# Patient Record
Sex: Female | Born: 2000 | Race: White | Hispanic: No | Marital: Single | Smoking: Never smoker
Health system: Southern US, Community
[De-identification: ages and names within clinical notes are randomized; demographics above are authoritative.]

---
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
## 2019-08-27 ENCOUNTER — Emergency Department
Admission: EM | Admit: 2019-08-27 | Discharge: 2019-08-27 | Disposition: A | Discharge status: Home / Self Care | Payer: BLUE CROSS/BLUE SHIELD | Attending: Emergency Medicine | Admitting: Emergency Medicine

## 2019-08-27 ENCOUNTER — Emergency Department: Payer: BLUE CROSS/BLUE SHIELD

## 2019-08-27 ENCOUNTER — Other Ambulatory Visit: Payer: Self-pay

## 2019-08-27 DIAGNOSIS — R109 Unspecified abdominal pain: Secondary | ICD-10-CM

## 2019-08-27 DIAGNOSIS — K599 Functional intestinal disorder, unspecified: Secondary | ICD-10-CM | POA: Insufficient documentation

## 2019-08-27 DIAGNOSIS — R1011 Right upper quadrant pain: Secondary | ICD-10-CM | POA: Insufficient documentation

## 2019-08-27 LAB — URINALYSIS, COMPLETE (UACMP) WITH MICROSCOPIC
Bacteria, UA: NONE SEEN
Bilirubin Urine: NEGATIVE
Glucose, UA: NEGATIVE mg/dL
Ketones, ur: 20 mg/dL — AB
Leukocytes,Ua: NEGATIVE
Nitrite: NEGATIVE
Protein, ur: 30 mg/dL — AB
Specific Gravity, Urine: 1.026 (ref 1.005–1.030)
pH: 5 (ref 5.0–8.0)

## 2019-08-27 LAB — COMPREHENSIVE METABOLIC PANEL
ALT: 18 U/L (ref 0–44)
AST: 21 U/L (ref 15–41)
Albumin: 4.5 g/dL (ref 3.5–5.0)
Alkaline Phosphatase: 55 U/L (ref 38–126)
Anion gap: 13 (ref 5–15)
BUN: 19 mg/dL (ref 6–20)
CO2: 22 mmol/L (ref 22–32)
Calcium: 9 mg/dL (ref 8.9–10.3)
Chloride: 102 mmol/L (ref 98–111)
Creatinine, Ser: 0.67 mg/dL (ref 0.44–1.00)
GFR calc Af Amer: >60 mL/min (ref >60)
GFR calc non Af Amer: >60 mL/min (ref >60)
Glucose, Bld: 96 mg/dL (ref 70–99)
Potassium: 3.7 mmol/L (ref 3.5–5.1)
Sodium: 137 mmol/L (ref 135–145)
Total Bilirubin: 0.8 mg/dL (ref 0.3–1.2)
Total Protein: 7.9 g/dL (ref 6.5–8.1)

## 2019-08-27 LAB — MONONUCLEOSIS SCREEN: Mono Screen: NEGATIVE

## 2019-08-27 LAB — CBC
HCT: 36.6 % (ref 36.0–46.0)
Hemoglobin: 12.2 g/dL (ref 12.0–15.0)
MCH: 27.5 pg (ref 26.0–34.0)
MCHC: 33.3 g/dL (ref 30.0–36.0)
MCV: 82.4 fL (ref 80.0–100.0)
Platelets: 278 10*3/uL (ref 150–400)
RBC: 4.44 MIL/uL (ref 3.87–5.11)
RDW: 14 % (ref 11.5–15.5)
WBC: 6.4 10*3/uL (ref 4.0–10.5)
nRBC: 0 % (ref 0.0–0.2)

## 2019-08-27 LAB — LIPASE, BLOOD: Lipase: 23 U/L (ref 11–51)

## 2019-08-27 LAB — PREGNANCY, URINE: Preg Test, Ur: NEGATIVE

## 2019-08-27 MED ORDER — DROPERIDOL 2.5 MG/ML IJ SOLN
2.5000 mg | Freq: Once | INTRAMUSCULAR | Status: AC
  Administered 2019-08-27: 2.5 mg via INTRAMUSCULAR
  Filled 2019-08-27: qty 2

## 2019-08-27 NOTE — ED Triage Notes (Signed)
Pt presents via POV with c/o RUQ abdominal pain that has been ongoing for 3 months. Pt denies vomiting or diarrhea  with pain. Pt describes pain as dull. Pt currently alert and oriented x4.

## 2019-08-27 NOTE — Discharge Instructions (Addendum)
Please take Tylenol and ibuprofen/Advil for your pain.  It is safe to take them together, or to alternate them every few hours.  Take up to 1000mg  of Tylenol at a time, up to 4 times per day.  Do not take more than 4000 mg of Tylenol in 24 hours.  For ibuprofen, take 400-600 mg, 4-5 times per day.  The ultrasound of your gallbladder and liver did not show any gallstones or problems. Your blood work is normal.  If you develop any fevers with your pain, frequent vomiting, chest pain or other associated symptoms, please return to the ED.

## 2019-08-27 NOTE — ED Provider Notes (Signed)
[facility] Emergency Department Provider Note ____________________________________________   First MD Initiated Contact with Patient 08/27/19 1959     (approximate)  I have reviewed the triage vital signs and the nursing notes.  HISTORY  Chief Complaint Abdominal Pain   HPI Angel Townsend is a 19 y.o. femalewho presents to the ED for evaluation of RUQ pain.  Chart review indicates 8/3 ED visit at OSH via care everywhere for right-sided abdominal pain.  Patient reports about 2 months of upper abdominal pain, primarily to the RUQ.  Patient reports the pain has been constant for a matter of weeks, not improving with Tylenol or NSAIDs, and aching pressure in nature and 5/10 intensity.  She denies any associated symptoms beyond her pain that has been constant she reports significant anxiety and distress about not knowing what the cause of her symptoms are.  Denies associated fevers, postprandial worsening of her pain, vomiting, diarrhea, dysuria, vaginal bleeding or discharge, chest pain, shortness of breath, syncope or cough.  She reports tolerating p.o. intake at her baseline and toileting at her baseline.  History reviewed. No pertinent past medical history.  There are no problems to display for this patient.   History reviewed. No pertinent surgical history.  Prior to Admission medications   Not on File    Allergies Patient has no known allergies.  History reviewed. No pertinent family history.  Social History Social History   Tobacco Use  . Smoking status: Never Smoker  . Smokeless tobacco: Never Used  Substance Use Topics  . Alcohol use: Not on file  . Drug use: Not on file    Review of Systems  Constitutional: No fever/chills Eyes: No visual changes. ENT: No sore throat. Cardiovascular: Denies chest pain. Respiratory: Denies shortness of breath. Gastrointestinal: Positive for abdominal pain.  No nausea, no vomiting.  No  diarrhea.  No constipation. Genitourinary: Negative for dysuria. Musculoskeletal: Negative for back pain. Skin: Negative for rash. Neurological: Negative for headaches, focal weakness or numbness.   ____________________________________________   PHYSICAL EXAM:  VITAL SIGNS: Vitals:   08/27/19 1349 08/27/19 1656  BP: (!) 129/101 134/85  Pulse: 88 69  Resp: 17 16  Temp: 98.7 F (37.1 C)   SpO2: 99%       Constitutional: Alert and oriented. Well appearing and in no acute distress.  Well-appearing and conversational full sentences without distress. Eyes: Conjunctivae are normal. PERRL. EOMI. Head: Atraumatic. Nose: No congestion/rhinnorhea. Mouth/Throat: Mucous membranes are moist.  Oropharynx non-erythematous. Neck: No stridor. No cervical spine tenderness to palpation. Cardiovascular: Normal rate, regular rhythm. Grossly normal heart sounds.  Good peripheral circulation. Respiratory: Normal respiratory effort.  No retractions. Lungs CTAB. Gastrointestinal: Soft , nondistended, nontender to palpation. No abdominal bruits. No CVA tenderness. Musculoskeletal: No lower extremity tenderness nor edema.  No joint effusions. No signs of acute trauma. Neurologic:  Normal speech and language. No gross focal neurologic deficits are appreciated. No gait instability noted. Skin:  Skin is warm, dry and intact. No rash noted. Psychiatric: Mood and affect are normal. Speech and behavior are normal.  ____________________________________________   LABS (all labs ordered are listed, but only abnormal results are displayed)  Labs Reviewed  URINALYSIS, COMPLETE (UACMP) WITH MICROSCOPIC - Abnormal; Notable for the following components:      Result Value   Color, Urine YELLOW (*)    APPearance HAZY (*)    Hgb urine dipstick LARGE (*)    Ketones, ur 20 (*)    Protein, ur 30 (*)  All other components within normal limits  LIPASE, BLOOD  COMPREHENSIVE METABOLIC PANEL  CBC  MONONUCLEOSIS  SCREEN  POC URINE PREG, ED   ____________________________________________  12 Lead EKG   ____________________________________________  RADIOLOGY  ED MD interpretation:  RUQ ultrasound without evidence of acute pathology  Official radiology report(s): US Abdomen Limited RUQ  Result Date: 08/27/2019 CLINICAL DATA:  19 year old female with right upper quadrant abdominal pain. EXAM: ULTRASOUND ABDOMEN LIMITED RIGHT UPPER QUADRANT COMPARISON:  None. FINDINGS: Gallbladder: No gallstones or wall thickening visualized. No sonographic Murphy sign noted by sonographer. Common bile duct: Diameter: 3 mm Liver: No focal lesion identified. Within normal limits in parenchymal echogenicity. Portal vein is patent on color Doppler imaging with normal direction of blood flow towards the liver. Other: None. IMPRESSION: Unremarkable right upper quadrant ultrasound. Electronically Signed   By: Elgie Collard M.D.   On: 08/27/2019 20:41    ____________________________________________   PROCEDURES and INTERVENTIONS  Procedure(s) performed (including Critical Care):  Procedures  Medications  droperidol (INAPSINE) 2.5 MG/ML injection 2.5 mg (has no administration in time range)    ____________________________________________   MDM / ED COURSE  Otherwise healthy 19 year old female presenting with chronic abdominal pain without evidence of acute pathology and amenable to outpatient management.  Normal vital signs on room air.  Exam without evidence of acute derangement.  Blood work without acute derangements.  Urine with evidence of dehydration, but no infectious features.  Patient reports resolving symptoms after droperidol administration.  RUQ ultrasound without evidence of biliary pathology as the source of her symptoms.  Due to the chronicity of her symptoms, no associated symptoms, benign exam and work-up I see no evidence of acute pathology to warrant additional work-up here in the ED or  hospitalization.  We discussed outpatient management and return precautions for the ED.  Patient medically stable for discharge home.  ____________________________________________   FINAL CLINICAL IMPRESSION(S) / ED DIAGNOSES  Final diagnoses:  RUQ abdominal pain  Functional abdominal pain syndrome     ED Discharge Orders    None       Angel Townsend   Note:  This document was prepared using Dragon voice recognition software and may include unintentional dictation errors.   Delton Prairie, MD 08/27/19 2106

## 2019-08-29 ENCOUNTER — Other Ambulatory Visit: Payer: Self-pay

## 2019-08-29 DIAGNOSIS — M5440 Lumbago with sciatica, unspecified side: Secondary | ICD-10-CM

## 2019-08-31 ENCOUNTER — Emergency Department: Payer: BLUE CROSS/BLUE SHIELD

## 2019-08-31 ENCOUNTER — Other Ambulatory Visit: Payer: Self-pay

## 2019-08-31 ENCOUNTER — Emergency Department
Admission: EM | Admit: 2019-08-31 | Discharge: 2019-08-31 | Disposition: A | Discharge status: Home / Self Care | Payer: BLUE CROSS/BLUE SHIELD | Attending: Emergency Medicine | Admitting: Emergency Medicine

## 2019-08-31 DIAGNOSIS — Z3202 Encounter for pregnancy test, result negative: Secondary | ICD-10-CM | POA: Diagnosis not present

## 2019-08-31 DIAGNOSIS — R1012 Left upper quadrant pain: Secondary | ICD-10-CM

## 2019-08-31 LAB — URINALYSIS, COMPLETE (UACMP) WITH MICROSCOPIC
Bilirubin Urine: NEGATIVE
Glucose, UA: NEGATIVE mg/dL
Ketones, ur: 5 mg/dL — AB
Leukocytes,Ua: NEGATIVE
Nitrite: NEGATIVE
Protein, ur: NEGATIVE mg/dL
Specific Gravity, Urine: 1.015 (ref 1.005–1.030)
pH: 6 (ref 5.0–8.0)

## 2019-08-31 LAB — COMPREHENSIVE METABOLIC PANEL
ALT: 16 U/L (ref 0–44)
AST: 18 U/L (ref 15–41)
Albumin: 4.6 g/dL (ref 3.5–5.0)
Alkaline Phosphatase: 59 U/L (ref 38–126)
Anion gap: 7 (ref 5–15)
BUN: 10 mg/dL (ref 6–20)
CO2: 27 mmol/L (ref 22–32)
Calcium: 9.3 mg/dL (ref 8.9–10.3)
Chloride: 105 mmol/L (ref 98–111)
Creatinine, Ser: 0.69 mg/dL (ref 0.44–1.00)
GFR calc Af Amer: >60 mL/min (ref >60)
GFR calc non Af Amer: >60 mL/min (ref >60)
Glucose, Bld: 92 mg/dL (ref 70–99)
Potassium: 3.9 mmol/L (ref 3.5–5.1)
Sodium: 139 mmol/L (ref 135–145)
Total Bilirubin: 0.8 mg/dL (ref 0.3–1.2)
Total Protein: 8 g/dL (ref 6.5–8.1)

## 2019-08-31 LAB — CBC
HCT: 39.6 % (ref 36.0–46.0)
Hemoglobin: 13.1 g/dL (ref 12.0–15.0)
MCH: 27.4 pg (ref 26.0–34.0)
MCHC: 33.1 g/dL (ref 30.0–36.0)
MCV: 82.8 fL (ref 80.0–100.0)
Platelets: 281 10*3/uL (ref 150–400)
RBC: 4.78 MIL/uL (ref 3.87–5.11)
RDW: 14 % (ref 11.5–15.5)
WBC: 4.7 10*3/uL (ref 4.0–10.5)
nRBC: 0 % (ref 0.0–0.2)

## 2019-08-31 LAB — POCT PREGNANCY, URINE: Preg Test, Ur: NEGATIVE

## 2019-08-31 LAB — LIPASE, BLOOD: Lipase: 24 U/L (ref 11–51)

## 2019-08-31 MED ORDER — IOHEXOL 9 MG/ML PO SOLN
500.0000 mL | Freq: Two times a day (BID) | ORAL | Status: DC | PRN
  Administered 2019-08-31: 500 mL via ORAL
  Filled 2019-08-31 (×2): qty 500

## 2019-08-31 MED ORDER — DICYCLOMINE HCL 10 MG PO CAPS: 10.0000 mg | ORAL_CAPSULE | Freq: Four times a day (QID) | ORAL | 0 refills | Status: AC | PRN

## 2019-08-31 MED ORDER — IOHEXOL 300 MG/ML  SOLN
75.0000 mL | Freq: Once | INTRAMUSCULAR | Status: AC | PRN
  Administered 2019-08-31: 75 mL via INTRAVENOUS
  Filled 2019-08-31: qty 75

## 2019-08-31 NOTE — ED Notes (Signed)
Attempted IV start x2, will IV team consult

## 2019-08-31 NOTE — ED Provider Notes (Signed)
[facility] Emergency Department Provider Note  ____________________________________________  Time seen: Approximately 5:16 PM  I have reviewed the triage vital signs and the nursing notes.   HISTORY  Chief Complaint Abdominal Pain    HPI Angel Townsend is a 19 y.o. female who presents emergency department complaining of abdominal pain x1 month.  Patient states that she initially had abdominal pain in the right upper quadrant.  She was evaluated with reassuring labs, reassuring ultrasound.  Patient states that her pain continued, and now seems to a transition to the left upper quadrant.  Still mild symptoms in the right upper quadrant but pain has shifted to the left side.  No flank pain.  No emesis, diarrhea or constipation.  Patient has had no blood in her stools.  No hematuria, dysuria, polyuria.  No vaginal bleeding or discharge.         History reviewed. No pertinent past medical history.  There are no problems to display for this patient.   History reviewed. No pertinent surgical history.  Prior to Admission medications   Medication Sig Start Date End Date Taking? Authorizing Provider  dicyclomine (BENTYL) 10 MG capsule Take 1 capsule (10 mg total) by mouth 4 (four) times daily as needed for up to 14 days (abdominal pain). 08/31/19 09/14/19  Dalia Jollie, Delorise Royals, PA-C    Allergies Patient has no known allergies.  No family history on file.  Social History Social History   Tobacco Use   Smoking status: Never Smoker   Smokeless tobacco: Never Used  Substance Use Topics   Alcohol use: Never   Drug use: Never     Review of Systems  Constitutional: No fever/chills Eyes: No visual changes. No discharge ENT: No upper respiratory complaints. Cardiovascular: no chest pain. Respiratory: no cough. No SOB. Gastrointestinal: Bilateral upper quadrant abdominal pain x1 month, originally starting on right now primarily on left.  No nausea,  no vomiting.  No diarrhea.  No constipation. Genitourinary: Negative for dysuria. No hematuria Musculoskeletal: Negative for musculoskeletal pain. Skin: Negative for rash, abrasions, lacerations, ecchymosis. Neurological: Negative for headaches, focal weakness or numbness. 10-point ROS otherwise negative.  ____________________________________________   PHYSICAL EXAM:  VITAL SIGNS: ED Triage Vitals  Enc Vitals Group     BP 08/31/19 1118 (!) 132/97     Pulse Rate 08/31/19 1118 (!) 105     Resp 08/31/19 1118 18     Temp 08/31/19 1118 98.4 F (36.9 C)     Temp src --      SpO2 08/31/19 1118 98 %     Weight 08/31/19 1119 127 lb (57.6 kg)     Height 08/31/19 1119 5\' 6"  (1.676 m)     Head Circumference --      Peak Flow --      Pain Score 08/31/19 1118 6     Pain Loc --      Pain Edu? --      Excl. in GC? --      Constitutional: Alert and oriented. Well appearing and in no acute distress. Eyes: Conjunctivae are normal. PERRL. EOMI. Head: Atraumatic. ENT:      Ears:       Nose: No congestion/rhinnorhea.      Mouth/Throat: Mucous membranes are moist.  Neck: No stridor.    Cardiovascular: Normal rate, regular rhythm. Normal S1 and S2.  Good peripheral circulation. Respiratory: Normal respiratory effort without tachypnea or retractions. Lungs CTAB. Good air entry to the bases with no decreased or absent  breath sounds. Gastrointestinal: Bowel sounds 4 quadrants.  Soft to palpation all quadrants.  Minimally tender to palpation in the left upper quadrant.  No palpable abnormality in this region.  No significant tenderness on the right upper quadrant.  No other tenderness.  No guarding or rigidity. No palpable masses. No distention. No CVA tenderness. Musculoskeletal: Full range of motion to all extremities. No gross deformities appreciated. Neurologic:  Normal speech and language. No gross focal neurologic deficits are appreciated.  Skin:  Skin is warm, dry and intact. No rash  noted. Psychiatric: Mood and affect are normal. Speech and behavior are normal. Patient exhibits appropriate insight and judgement.   ____________________________________________   LABS (all labs ordered are listed, but only abnormal results are displayed)  Labs Reviewed  URINALYSIS, COMPLETE (UACMP) WITH MICROSCOPIC - Abnormal; Notable for the following components:      Result Value   Color, Urine YELLOW (*)    APPearance CLEAR (*)    Hgb urine dipstick MODERATE (*)    Ketones, ur 5 (*)    Bacteria, UA RARE (*)    All other components within normal limits  LIPASE, BLOOD  COMPREHENSIVE METABOLIC PANEL  CBC  POCT PREGNANCY, URINE  POC URINE PREG, ED   ____________________________________________  EKG   ____________________________________________  RADIOLOGY I personally viewed and evaluated these images as part of my medical decision making, as well as reviewing the written report by the radiologist.  CT ABDOMEN PELVIS W CONTRAST  Result Date: 08/31/2019 CLINICAL DATA:  Right upper quadrant and left upper quadrant pain EXAM: CT ABDOMEN AND PELVIS WITH CONTRAST TECHNIQUE: Multidetector CT imaging of the abdomen and pelvis was performed using the standard protocol following bolus administration of intravenous contrast. CONTRAST:  51mL OMNIPAQUE IOHEXOL 300 MG/ML  SOLN COMPARISON:  None. FINDINGS: Lower chest: The visualized heart size within normal limits. No pericardial fluid/thickening. No hiatal hernia. The visualized portions of the lungs are clear. Hepatobiliary: The liver is normal in density without focal abnormality.The main portal vein is patent. No evidence of calcified gallstones, gallbladder wall thickening or biliary dilatation. Pancreas: Unremarkable. No pancreatic ductal dilatation or surrounding inflammatory changes. Spleen: Normal in size without focal abnormality. Adrenals/Urinary Tract: Both adrenal glands appear normal. The kidneys and collecting system appear  normal without evidence of urinary tract calculus or hydronephrosis. Bladder is unremarkable. Stomach/Bowel: There is a mildly dilated contrast and debris filled stomach. No definite wall thickening however is noted. Small bowel, and colon are normal in appearance. No inflammatory changes, wall thickening, or obstructive findings.The appendix is normal. Vascular/Lymphatic: There are no enlarged mesenteric, retroperitoneal, or pelvic lymph nodes. No significant vascular findings are present. Reproductive:  The uterus and adnexa are unremarkable. Other: No evidence of abdominal wall mass or hernia. Musculoskeletal: No acute or significant osseous findings. IMPRESSION: No acute intra-abdominal or pelvic pathology to explain the patient's symptoms Electronically Signed   By: Jonna Clark M.D.   On: 08/31/2019 19:33    ____________________________________________    PROCEDURES  Procedure(s) performed:    Procedures    Medications  iohexol (OMNIPAQUE) 9 MG/ML oral solution 500 mL (500 mLs Oral Contrast Given 08/31/19 1744)  iohexol (OMNIPAQUE) 300 MG/ML solution 75 mL (75 mLs Intravenous Contrast Given 08/31/19 1917)     ____________________________________________   INITIAL IMPRESSION / ASSESSMENT AND PLAN / ED COURSE  Pertinent labs & imaging results that were available during my care of the patient were reviewed by me and considered in my medical decision making (see chart for details).  Review of the [place] CSRS was performed in accordance of the [facility] prior to dispensing any controlled drugs.           Patient's diagnosis is consistent with nonspecific left upper quadrant abdominal pain.  Patient presents emergency department with abdominal pain x1 month.  Initially symptoms began on the right, now are primarily on the left upper quadrant.  Patient had reassuring labs, ultrasound at the initial onset of her right upper quadrant abdominal pain.  With pain moving to the left upper quadrant,  patient returns for evaluation.  Overall labs are reassuring.  Exam was reassuring.  Imaging with CT scan was undertaken given the new location of her pain and length of duration.  CT reveals no findings to explain the patient's symptoms.  Overall reassuring CT.  This time I have recommended follow-up with GI.  Bentyl for symptom relief..  Return precautions are discussed with the patient.  Patient is given ED precautions to return to the ED for any worsening or new symptoms.     ____________________________________________  FINAL CLINICAL IMPRESSION(S) / ED DIAGNOSES  Final diagnoses:  Left upper quadrant abdominal pain      NEW MEDICATIONS STARTED DURING THIS VISIT:  ED Discharge Orders         Ordered    dicyclomine (BENTYL) 10 MG capsule  4 times daily PRN        08/31/19 1946              This chart was dictated using voice recognition software/Dragon. Despite best efforts to proofread, errors can occur which can change the meaning. Any change was purely unintentional.    Racheal Patches, PA-C 08/31/19 1948    Delton Prairie, MD 09/01/19 [phone]

## 2019-08-31 NOTE — ED Notes (Signed)
Pt reports abdominal pain (RUQ, radiate to back) since July.   Pt reports "a couple days ago" she began having "gas like cramping pain" to her LUQ. Reports taking gas-x with minimal relief.   Denies vomiting/diarrhea, confirms intermittent nausea   Reports pain is worse at night. Denies urinary frequency or pain with urination.

## 2019-08-31 NOTE — ED Triage Notes (Addendum)
Pt comes via POV from home with c/o bilateral abdomen pain. Pt states she was just evaluated for this same thing and an [place] was done. Pt states pain is worse and she feels like maybe she needs an CT scan.  Pt states some nausea.  Pt states it has been ongoing and has just changed. Pt states some decreased appetite.

## 2019-09-18 ENCOUNTER — Other Ambulatory Visit: Payer: Self-pay

## 2019-09-18 ENCOUNTER — Ambulatory Visit
Admission: RE | Admit: 2019-09-18 | Discharge: 2019-09-18 | Disposition: A | Discharge status: Home / Self Care | Payer: BLUE CROSS/BLUE SHIELD | Source: Ambulatory Visit

## 2019-09-18 DIAGNOSIS — M5440 Lumbago with sciatica, unspecified side: Secondary | ICD-10-CM | POA: Insufficient documentation

## 2019-11-13 ENCOUNTER — Ambulatory Visit: Payer: BLUE CROSS/BLUE SHIELD | Admitting: Gastroenterology

## 2019-12-10 ENCOUNTER — Other Ambulatory Visit: Payer: Self-pay | Admitting: Neurology

## 2019-12-10 DIAGNOSIS — G44209 Tension-type headache, unspecified, not intractable: Secondary | ICD-10-CM

## 2019-12-19 ENCOUNTER — Other Ambulatory Visit: Payer: BLUE CROSS/BLUE SHIELD

## 2022-04-27 IMAGING — MR MR LUMBAR SPINE W/O CM
5 series · 32 of 48 positions shown · non-contrast
Comparison: None.

CLINICAL DATA: Central low back pain. Two months of peripheral
abdominal pain

EXAM:
MRI LUMBAR SPINE WITHOUT CONTRAST
TECHNIQUE: Multiplanar, multisequence MR imaging of the lumbar spine was
performed. No intravenous contrast was administered.

[Series 5: T2 · sagittal · 4.0mm · 0.81mm/px · 6 of 15 slices shown (1 of 2)]
[im 1/15]
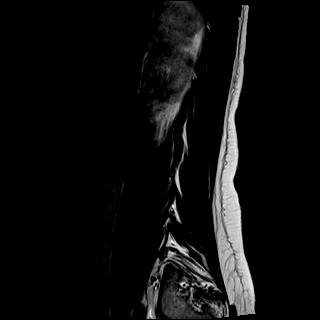
[im 3/15]
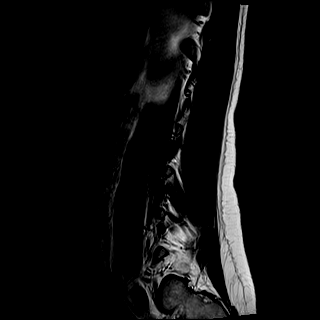
[im 6/15]
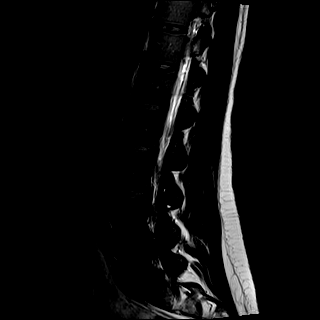
[im 9/15]
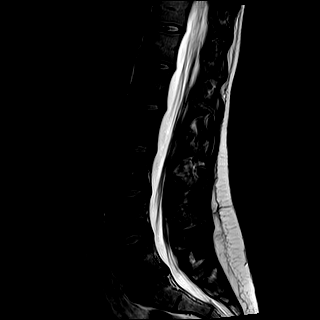
[im 12/15]
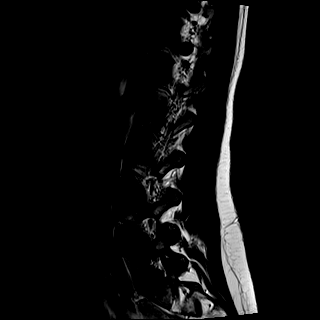
[im 15/15]
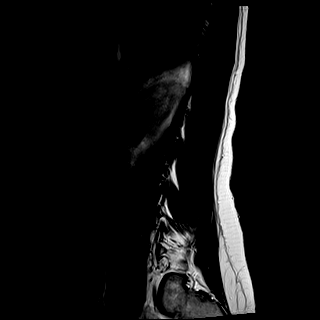

[Series 6: T1 · sagittal · 4.0mm · 0.81mm/px · 6 of 15 slices shown (1 of 2)]
[im 1/15]
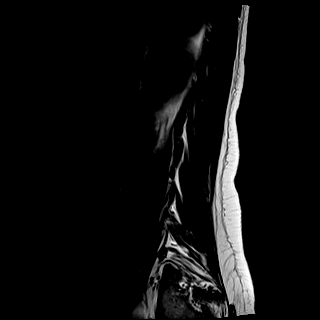
[im 3/15]
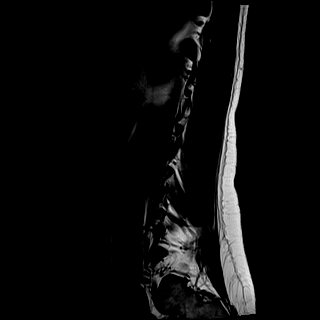
[im 6/15]
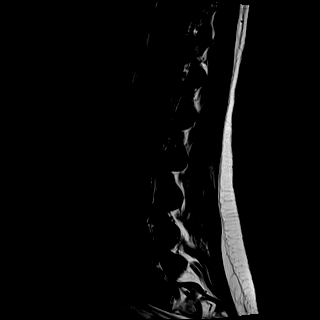
[im 9/15]
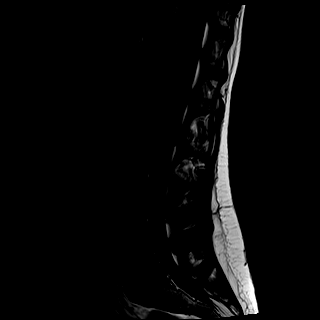
[im 12/15]
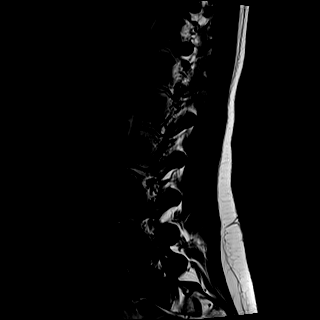
[im 15/15]
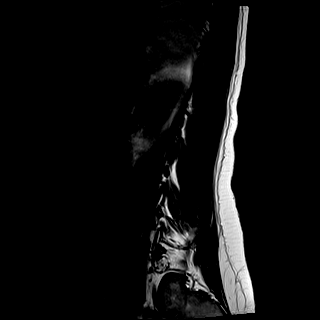

[Series 7: STIR · sagittal · 4.0mm · 0.41mm/px · 2 of 15 slices shown]
[im 1/15]
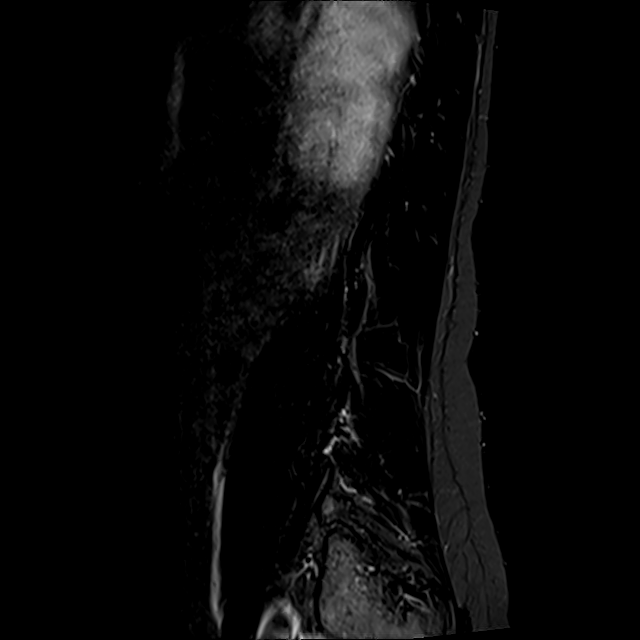
[im 3/15]
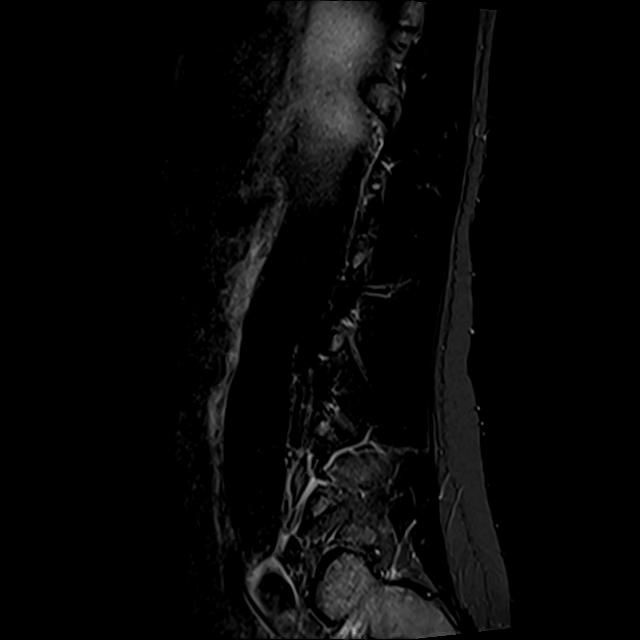

[Series 8: T2 · axial · 4.0mm · 0.78mm/px · z∈[-177,+33]mm · 9 of 36 slices shown (2 of 2)]
[im 1/36]
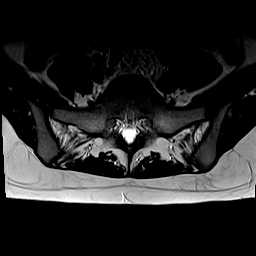
[im 6/36]
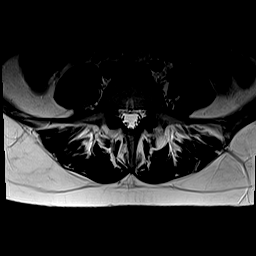
[im 11/36]
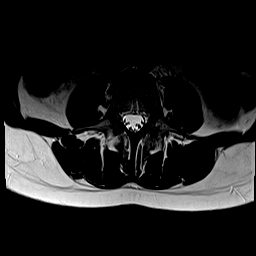
[im 16/36]
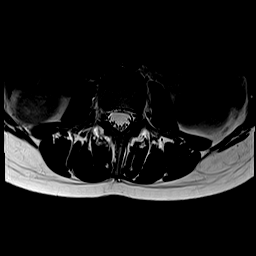
[im 18/36]
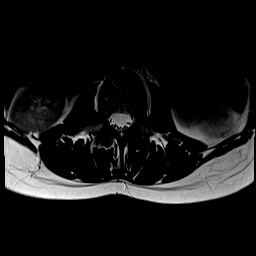
[im 21/36]
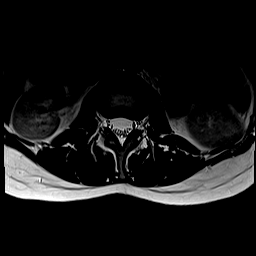
[im 26/36]
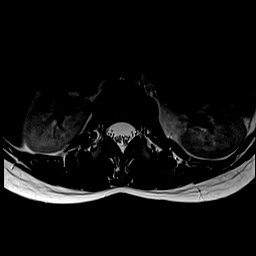
[im 31/36]
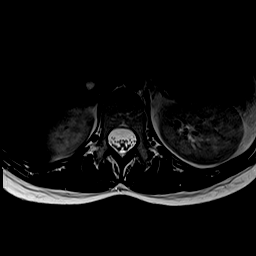
[im 36/36]
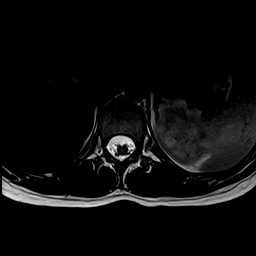

[Series 9: T1 · axial · 4.0mm · 0.39mm/px · z∈[-177,+33]mm · 9 of 36 slices shown (2 of 2)]
[im 1/36]
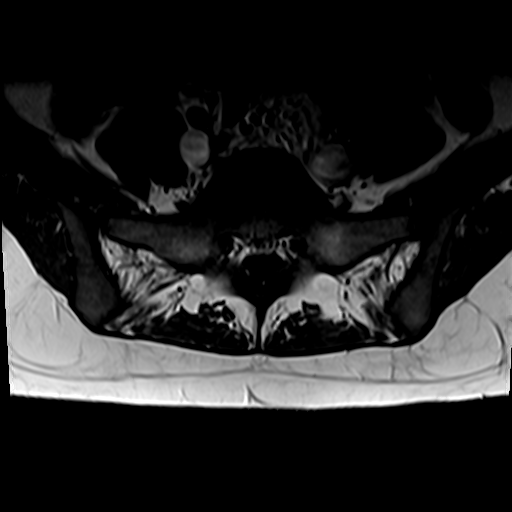
[im 6/36]
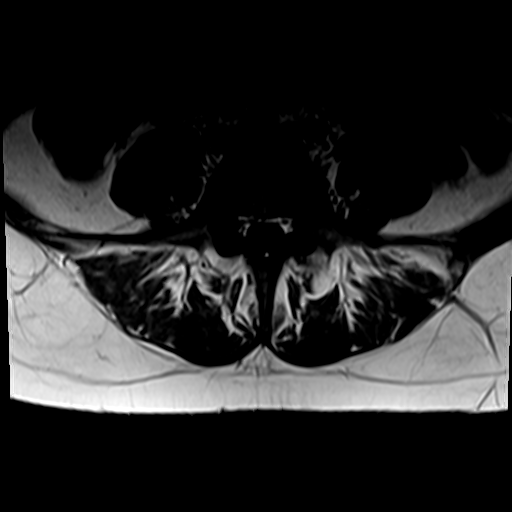
[im 11/36]
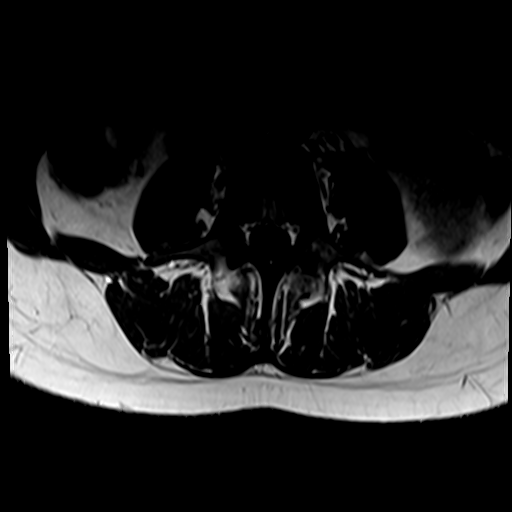
[im 16/36]
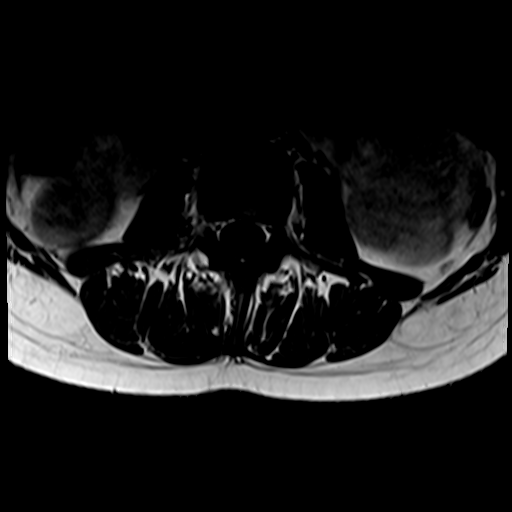
[im 18/36]
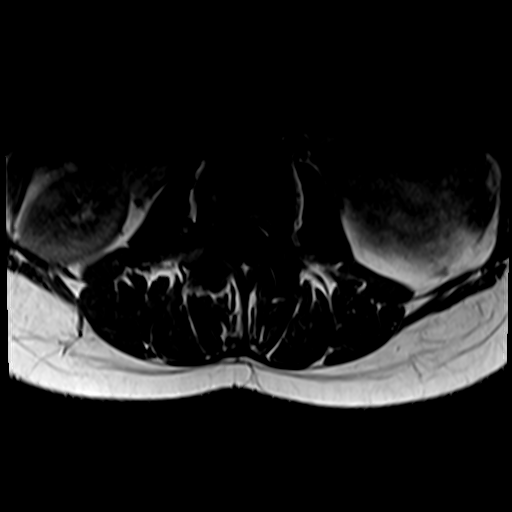
[im 21/36]
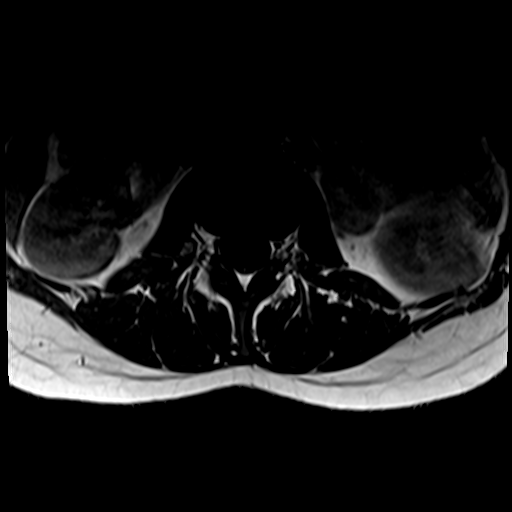
[im 26/36]
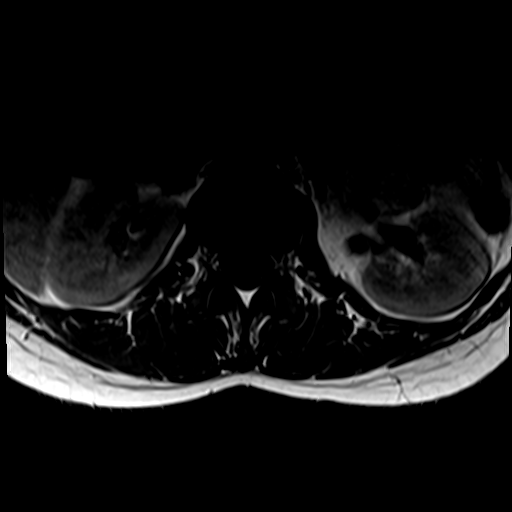
[im 31/36]
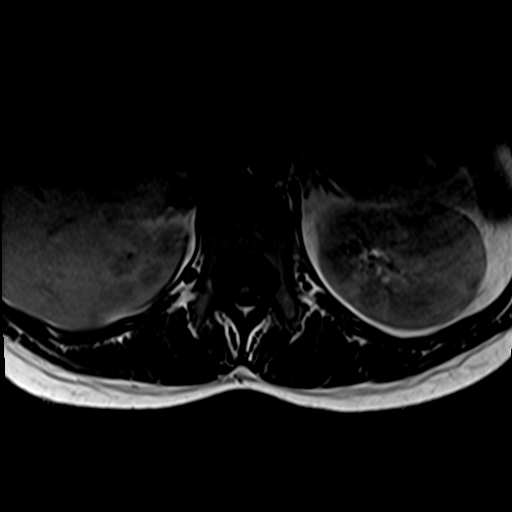
[im 36/36]
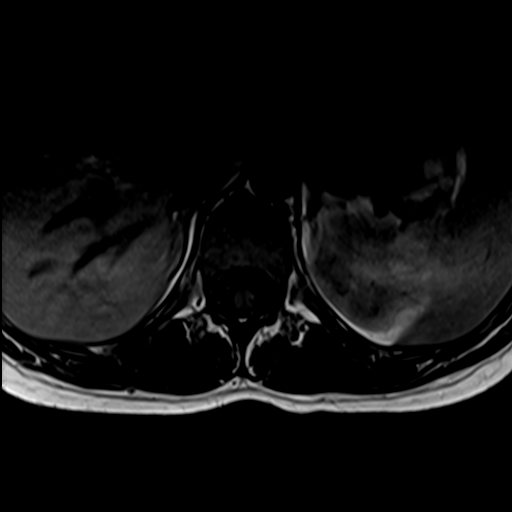

[32 of 48 positions shown; findings below may reference images not displayed]

FINDINGS: Segmentation: 5 lumbar type vertebrae based on the available
coverage

Alignment:  Normal

Vertebrae:  No fracture, evidence of discitis, or bone lesion.

Conus medullaris and cauda equina: Conus extends to the L1 level.
Conus and cauda equina appear normal.

Paraspinal and other soft tissues: Negative

Disc levels:

L5-S1 disc desiccation and narrowing with small central protrusion.
Normal appearance of the facets. No impingement throughout the
lumbar spine.
IMPRESSION: L5-S1 disc desiccation with small protrusion. No neural compression
or visible inflammation throughout the lumbar spine.
